# Patient Record
Sex: Male | Born: 1985 | Race: White | Hispanic: No | Marital: Single | Smoking: Current every day smoker
Health system: Southern US, Community
[De-identification: ages and names within clinical notes are randomized; demographics above are authoritative.]

## PROBLEM LIST (undated history)

## (undated) HISTORY — PX: TYMPANOSTOMY TUBE PLACEMENT: SHX32

---

## 2012-02-16 ENCOUNTER — Emergency Department: Payer: Self-pay | Admitting: Emergency Medicine

## 2012-03-29 ENCOUNTER — Other Ambulatory Visit: Payer: Self-pay | Admitting: Ophthalmology

## 2012-04-01 LAB — WOUND CULTURE

## 2012-11-04 ENCOUNTER — Emergency Department: Payer: Self-pay | Admitting: Emergency Medicine

## 2013-02-13 ENCOUNTER — Ambulatory Visit: Payer: Self-pay | Admitting: Family Medicine

## 2016-06-14 ENCOUNTER — Emergency Department: Payer: BLUE CROSS/BLUE SHIELD

## 2016-06-14 ENCOUNTER — Emergency Department
Admission: EM | Admit: 2016-06-14 | Discharge: 2016-06-14 | Disposition: A | Discharge status: Home / Self Care | Payer: BLUE CROSS/BLUE SHIELD | Attending: Emergency Medicine | Admitting: Emergency Medicine

## 2016-06-14 DIAGNOSIS — M79671 Pain in right foot: Secondary | ICD-10-CM | POA: Diagnosis present

## 2016-06-14 DIAGNOSIS — F17200 Nicotine dependence, unspecified, uncomplicated: Secondary | ICD-10-CM | POA: Insufficient documentation

## 2016-06-14 DIAGNOSIS — L03115 Cellulitis of right lower limb: Secondary | ICD-10-CM | POA: Insufficient documentation

## 2016-06-14 MED ORDER — TRAMADOL HCL 50 MG PO TABS
50.0000 mg | ORAL_TABLET | Freq: Once | ORAL | Status: AC
  Administered 2016-06-14: 50 mg via ORAL
  Filled 2016-06-14: qty 1

## 2016-06-14 MED ORDER — KETOROLAC TROMETHAMINE 60 MG/2ML IM SOLN
60.0000 mg | Freq: Once | INTRAMUSCULAR | Status: DC
  Filled 2016-06-14: qty 2

## 2016-06-14 MED ORDER — CLINDAMYCIN HCL 150 MG PO CAPS
300.0000 mg | ORAL_CAPSULE | Freq: Once | ORAL | Status: AC
  Administered 2016-06-14: 300 mg via ORAL
  Filled 2016-06-14: qty 2

## 2016-06-14 MED ORDER — CLINDAMYCIN HCL 300 MG PO CAPS: 300.0000 mg | ORAL_CAPSULE | Freq: Three times a day (TID) | ORAL | 0 refills | Status: AC

## 2016-06-14 MED ORDER — TRAMADOL HCL 50 MG PO TABS: 50.0000 mg | ORAL_TABLET | Freq: Four times a day (QID) | ORAL | 0 refills | Status: DC | PRN

## 2016-06-14 NOTE — ED Provider Notes (Signed)
[facility] Emergency Department Provider Note   ____________________________________________   First MD Initiated Contact with Patient 06/14/16 0245     (approximate)  I have reviewed the triage vital signs and the nursing notes.   HISTORY  Chief Complaint Foot Pain (Right)    HPI Glenn Carlson is a 30 y.o. male who comes into the hospital today with foot pain. He reports that he was at work and hit it on something metal at the shot. He reports that he didn't think much of it and then went to his friend's house. He was at a party and his foot was swelling and having some pain. He reports that his foot has been bruised as well. They did attempt to rest ice and elevate his foot but the pain has continued. Tonight the patient was taking a shower and when he tried to go to bed the pain was so excruciating he couldn't tolerate it anymore. According to mom the patient has taken nothing for his pain. He's had broken fingers and knuckles in the past but has never broken his foot. The patient rates his pain a 7 out of 10 in intensity. He is here today for evaluation. He is able to move his foot but is very tender to touch.   History reviewed. No pertinent past medical history.  There are no active problems to display for this patient.   History reviewed. No pertinent surgical history.  Prior to Admission medications   Medication Sig Start Date End Date Taking? Authorizing Provider  clindamycin (CLEOCIN) 300 MG capsule Take 1 capsule (300 mg total) by mouth 3 (three) times daily. 06/14/16 06/24/16  Rebecka ApleyAllison P Lamira Borin, MD  traMADol (ULTRAM) 50 MG tablet Take 1 tablet (50 mg total) by mouth every 6 (six) hours as needed. 06/14/16   Rebecka ApleyAllison P Desarai Barrack, MD    Allergies Patient has no known allergies.  No family history on file.  Social History Social History  Substance Use Topics  . Smoking status: Current Every Day Smoker  . Smokeless tobacco: Not on file  .  Alcohol use Yes    Review of Systems Constitutional: No fever/chills Eyes: No visual changes. ENT: No sore throat. Cardiovascular: Denies chest pain. Respiratory: Denies shortness of breath. Gastrointestinal: No abdominal pain.  No nausea, no vomiting.  No diarrhea.  No constipation. Genitourinary: Negative for dysuria. Musculoskeletal: Right foot pain Skin: Negative for rash. Neurological: Negative for headaches, focal weakness or numbness.  10-point ROS otherwise negative.  ____________________________________________   PHYSICAL EXAM:  VITAL SIGNS: ED Triage Vitals  Enc Vitals Group     BP 06/14/16 0113 (!) 139/96     Pulse Rate 06/14/16 0113 93     Resp 06/14/16 0113 16     Temp 06/14/16 0113 97.4 F (36.3 C)     Temp Source 06/14/16 0113 Oral     SpO2 06/14/16 0113 98 %     Weight 06/14/16 0111 132 lb (59.9 kg)     Height 06/14/16 0111 5\' 6"  (1.676 m)     Head Circumference --      Peak Flow --      Pain Score 06/14/16 0112 8     Pain Loc --      Pain Edu? --      Excl. in GC? --     Constitutional: Alert and oriented. Well appearing and in mild distress. Eyes: Conjunctivae are normal. PERRL. EOMI. Head: Atraumatic. Nose: No congestion/rhinnorhea. Mouth/Throat: Mucous membranes are  moist.  Oropharynx non-erythematous. Cardiovascular: Normal rate, regular rhythm. Grossly normal heart sounds.  Good peripheral circulation. Respiratory: Normal respiratory effort.  No retractions. Lungs CTAB. Gastrointestinal: Soft and nontender. No distention. Positive bowel sounds Musculoskeletal: Erythema to the top of the patient's right foot with some swelling and tenderness to palpation. The area is warm as well. No pain to palpation of passive range of motion of the foot or ankle. The patient reports that his foot hurts when I flex his toes.  Neurologic:  Normal speech and language.  Skin:  Skin is warm, dry and intact. Erythema to the top of the patient's foot. Psychiatric:  Mood and affect are normal.   ____________________________________________   LABS (all labs ordered are listed, but only abnormal results are displayed)  Labs Reviewed - No data to display ____________________________________________  EKG  none ____________________________________________  RADIOLOGY  Right foot x-ray ____________________________________________   PROCEDURES  Procedure(s) performed: None  Procedures  Critical Care performed: No  ____________________________________________   INITIAL IMPRESSION / ASSESSMENT AND PLAN / ED COURSE  Pertinent labs & imaging results that were available during my care of the patient were reviewed by me and considered in my medical decision making (see chart for details).  This is a 30 year old male who comes into the hospital today with some right foot pain. The patient reports that he didn't think hitting his toe caused the discomfort. The patient does have some warmth and erythema with a concern for cellulitis. I will give the patient a shot of Toradol as well as some clindamycin. I will also give the patient some tramadol for his pain. I will evaluate the patient's x-ray.  Clinical Course as of Jun 15 327  [facility]  0245 Negative. DG Foot Complete Right [AW]    Clinical Course User Index [AW] Rebecka ApleyAllison P Yaseen Gilberg, MD    The patient does not have any broken bones in his foot. I will give the patient some crutches and treat him for cellulitis. I will have her follow-up with the podiatrist. The patient will be discharged home. ____________________________________________   FINAL CLINICAL IMPRESSION(S) / ED DIAGNOSES  Final diagnoses:  Cellulitis of right lower extremity  Right foot pain      NEW MEDICATIONS STARTED DURING THIS VISIT:  New Prescriptions   CLINDAMYCIN (CLEOCIN) 300 MG CAPSULE    Take 1 capsule (300 mg total) by mouth 3 (three) times daily.   TRAMADOL (ULTRAM) 50 MG TABLET    Take 1 tablet  (50 mg total) by mouth every 6 (six) hours as needed.     Note:  This document was prepared using Dragon voice recognition software and may include unintentional dictation errors.    Rebecka ApleyAllison P Johnnisha Forton, MD 06/14/16 [phone]

## 2016-06-14 NOTE — ED Triage Notes (Signed)
Patient presents with right foot pain. Unsure of injury and states he "doesn't know what he did to it." Top of right foot is red and slightly swollen. Painful to palpation. Motor/sensation intact.

## 2016-06-14 NOTE — ED Notes (Signed)
Family refused crutches.

## 2016-06-14 NOTE — ED Notes (Signed)
Pt discharged to home.  Family member driving.  Discharge instructions reviewed.  Verbalized understanding.  No questions or concerns at this time.  Teach back verified.  Pt in NAD.  No items left in ED.   

## 2017-12-01 ENCOUNTER — Other Ambulatory Visit: Payer: Self-pay

## 2017-12-01 ENCOUNTER — Encounter: Payer: Self-pay | Admitting: Gynecology

## 2017-12-01 ENCOUNTER — Ambulatory Visit
Admission: EM | Admit: 2017-12-01 | Discharge: 2017-12-01 | Disposition: A | Discharge status: Home / Self Care | Payer: BLUE CROSS/BLUE SHIELD | Attending: Family Medicine | Admitting: Family Medicine

## 2017-12-01 DIAGNOSIS — L0231 Cutaneous abscess of buttock: Secondary | ICD-10-CM

## 2017-12-01 DIAGNOSIS — S40862A Insect bite (nonvenomous) of left upper arm, initial encounter: Secondary | ICD-10-CM | POA: Diagnosis not present

## 2017-12-01 DIAGNOSIS — W57XXXA Bitten or stung by nonvenomous insect and other nonvenomous arthropods, initial encounter: Secondary | ICD-10-CM | POA: Diagnosis not present

## 2017-12-01 DIAGNOSIS — J3489 Other specified disorders of nose and nasal sinuses: Secondary | ICD-10-CM

## 2017-12-01 DIAGNOSIS — L0291 Cutaneous abscess, unspecified: Secondary | ICD-10-CM

## 2017-12-01 MED ORDER — DOXYCYCLINE HYCLATE 100 MG PO CAPS: 100.0000 mg | ORAL_CAPSULE | Freq: Two times a day (BID) | ORAL | 0 refills | Status: DC

## 2017-12-01 MED ORDER — HYDROCODONE-ACETAMINOPHEN 5-325 MG PO TABS: 1.0000 | ORAL_TABLET | Freq: Three times a day (TID) | ORAL | 0 refills | Status: DC | PRN

## 2017-12-01 MED ORDER — MUPIROCIN 2 % EX OINT: 1.0000 "application " | TOPICAL_OINTMENT | Freq: Two times a day (BID) | CUTANEOUS | 0 refills | Status: AC

## 2017-12-01 NOTE — Discharge Instructions (Signed)
Meds as prescribed.  Keep the wound covered if possible.  Take care  Dr. Adriana Simas

## 2017-12-01 NOTE — ED Triage Notes (Signed)
Per patient c/o right nostril infection / cyst on right buttock painful to sit and tick bite on left arm.

## 2017-12-01 NOTE — ED Provider Notes (Signed)
[facility]    CSN: [id number] Arrival date & time: 12/01/17  1755  History   Chief Complaint Chief Complaint  Patient presents with  . Cyst  . Insect Bite   HPI  32 year old male presents with multiple complaints.  Patient states that on Tuesday he developed a sore in his right nostril.  He states that he squeezed the area and pus came out.  This is continues to recur.  He reports redness as well as pain of the right nostril.  He has felt feverish but does not have a temperature.  No known exacerbating or relieving factors.  Additionally, patient has had a recent tick bite which is concerning for him.  Occurred on the left arm.  Lastly, patient has developed an abscess of his right buttock.  This just appeared a couple days ago.  He reports that his pain is severe.  Difficulty sitting.  He is taken over-the-counter pain medication without improvement.  No other associated symptoms.  No other complaints.  PMH -patellofemoral syndrome, hx of abscess.  Past Surgical History:  Procedure Laterality Date  . TYMPANOSTOMY TUBE PLACEMENT     Home Medications    Prior to Admission medications   Medication Sig Start Date End Date Taking? Authorizing Provider  doxycycline (VIBRAMYCIN) 100 MG capsule Take 1 capsule (100 mg total) by mouth 2 (two) times daily. 12/01/17   Tommie Sams, DO  HYDROcodone-acetaminophen (NORCO/VICODIN) 5-325 MG tablet Take 1 tablet by mouth every 8 (eight) hours as needed. 12/01/17   Tommie Sams, DO  mupirocin ointment (BACTROBAN) 2 % Place 1 application into the nose 2 (two) times daily for 7 days. 12/01/17 12/08/17  Tommie Sams, DO   Family History Family History  Problem Relation Age of Onset  . Hypertension Mother   . Diabetes Father   . Hypertension Father   . Gout Father    Social History Social History   Tobacco Use  . Smoking status: Former Games developer  . Smokeless tobacco: Never Used  Substance Use Topics  . Alcohol use: Yes  . Drug use:  Never   Allergies   Patient has no known allergies.  Review of Systems Review of Systems  HENT:       Sore in nose.  Skin:       Abscess.    Physical Exam Triage Vital Signs ED Triage Vitals  Enc Vitals Group     BP 12/01/17 1807 123/88     Pulse Rate 12/01/17 1807 97     Resp 12/01/17 1807 16     Temp 12/01/17 1807 98.3 F (36.8 C)     Temp Source 12/01/17 1807 Oral     SpO2 12/01/17 1807 100 %     Weight 12/01/17 1808 155 lb (70.3 kg)     Height --      Head Circumference --      Peak Flow --      Pain Score 12/01/17 1808 8     Pain Loc --      Pain Edu? --      Excl. in GC? --    Updated Vital Signs BP 123/88 (BP Location: Left Arm)   Pulse 97   Temp 98.3 F (36.8 C) (Oral)   Resp 16   Wt 155 lb (70.3 kg)   SpO2 100%   BMI 25.02 kg/m   Physical Exam  Constitutional: He is oriented to person, place, and time. He appears well-developed.  Distress secondary to  pain.  HENT:  Head: Normocephalic and atraumatic.  Patient has a lesion in his nose.  He has erythema of the nostril/right side of his nose.  No appreciable abscess.  Pulmonary/Chest: Effort normal. No respiratory distress.  Neurological: He is alert and oriented to person, place, and time.  Skin:  Right buttock abscess with induration (located near the intergluteal cleft).  Minimal fluctuance.  Area of tick bite noted on the left upper arm reveals an eschar.  No surrounding erythema.  Psychiatric: He has a normal mood and affect. His behavior is normal.  Nursing note and vitals reviewed.  UC Treatments / Results  Labs (all labs ordered are listed, but only abnormal results are displayed) Labs Reviewed - No data to display  EKG None  Radiology No results found.  Procedures Incision and Drainage  Date/Time: 12/01/2017 8:04 PM  Performed by: Tommie Sams, DO  Authorized by: Tommie Sams, DO   Consent:    Consent obtained:  Verbal   Consent given by:  Patient Location:    Type:   Abscess   Location:  Lower extremity   Lower extremity location:  Buttock   Buttock location:  R buttock Pre-procedure details:    Skin preparation:  Betadine Anesthesia (see MAR for exact dosages):    Anesthesia method:  Local infiltration   Local anesthetic:  Lidocaine 1% WITH epi Procedure type:    Complexity:  Simple Procedure details:    Incision types:  Single straight   Scalpel blade:  11   Drainage:  Bloody (Minimal purulence.)   Drainage amount:  Scant   Wound treatment:  Wound left open   Packing materials:  None Post-procedure details:    Patient tolerance of procedure:  Tolerated well, no immediate complications   (including critical care time)  Medications Ordered in UC Medications - No data to display  Initial Impression / Assessment and Plan / UC Course  I have reviewed the triage vital signs and the nursing notes.  Pertinent labs & imaging results that were available during my care of the patient were reviewed by me and considered in my medical decision making (see chart for details).    32 year old male presents with buttock abscess, nasal sore/vestibulitis, and tick bite.  Incision and drainage performed today.  Minimal drainage.  Wound was left open.  No packing.  Placing on doxycycline and Bactroban.  Vicodin for pain.  [place] database was reviewed.  No concerns.  Final Clinical Impressions(s) / UC Diagnoses   Final diagnoses:  Abscess  Nasal vestibulitis  Tick bite, initial encounter     Discharge Instructions     Meds as prescribed.  Keep the wound covered if possible.  Take care  Dr. Adriana Simas    ED Prescriptions    Medication Sig Dispense Auth. Provider   doxycycline (VIBRAMYCIN) 100 MG capsule Take 1 capsule (100 mg total) by mouth 2 (two) times daily. 20 capsule Eldora, Elvera Almario G, DO   mupirocin ointment (BACTROBAN) 2 % Place 1 application into the nose 2 (two) times daily for 7 days. 22 g Bacci, Kisa Fujii G, DO   HYDROcodone-acetaminophen  (NORCO/VICODIN) 5-325 MG tablet Take 1 tablet by mouth every 8 (eight) hours as needed. 10 tablet Tommie Sams, DO     Controlled Substance Prescriptions [place] Controlled Substance Registry consulted? Yes, I have consulted the [place] Controlled Substances Registry for this patient, and feel the risk/benefit ratio today is favorable for proceeding with this prescription for a controlled substance.  Adriana Simas,  Verdis Frederickson, DO 12/01/17 2006

## 2018-08-14 ENCOUNTER — Other Ambulatory Visit: Payer: Self-pay

## 2018-08-14 ENCOUNTER — Emergency Department
Admission: EM | Admit: 2018-08-14 | Discharge: 2018-08-14 | Disposition: A | Discharge status: Home / Self Care | Payer: BLUE CROSS/BLUE SHIELD | Attending: Emergency Medicine | Admitting: Emergency Medicine

## 2018-08-14 ENCOUNTER — Encounter: Payer: Self-pay | Admitting: Emergency Medicine

## 2018-08-14 ENCOUNTER — Emergency Department: Payer: BLUE CROSS/BLUE SHIELD

## 2018-08-14 DIAGNOSIS — R1032 Left lower quadrant pain: Secondary | ICD-10-CM | POA: Diagnosis present

## 2018-08-14 DIAGNOSIS — N12 Tubulo-interstitial nephritis, not specified as acute or chronic: Secondary | ICD-10-CM | POA: Diagnosis not present

## 2018-08-14 DIAGNOSIS — N390 Urinary tract infection, site not specified: Secondary | ICD-10-CM | POA: Insufficient documentation

## 2018-08-14 DIAGNOSIS — Z87891 Personal history of nicotine dependence: Secondary | ICD-10-CM | POA: Insufficient documentation

## 2018-08-14 DIAGNOSIS — N23 Unspecified renal colic: Secondary | ICD-10-CM | POA: Diagnosis not present

## 2018-08-14 LAB — COMPREHENSIVE METABOLIC PANEL
ALT: 21 U/L (ref 0–44)
AST: 27 U/L (ref 15–41)
Albumin: 4.5 g/dL (ref 3.5–5.0)
Alkaline Phosphatase: 50 U/L (ref 38–126)
Anion gap: 7 (ref 5–15)
BUN: 14 mg/dL (ref 6–20)
CHLORIDE: 106 mmol/L (ref 98–111)
CO2: 26 mmol/L (ref 22–32)
Calcium: 9 mg/dL (ref 8.9–10.3)
Creatinine, Ser: 0.87 mg/dL (ref 0.61–1.24)
GFR calc Af Amer: >60 mL/min (ref >60)
Glucose, Bld: 156 mg/dL — ABNORMAL HIGH (ref 70–99)
Potassium: 3.6 mmol/L (ref 3.5–5.1)
Sodium: 139 mmol/L (ref 135–145)
Total Bilirubin: 2.2 mg/dL — ABNORMAL HIGH (ref 0.3–1.2)
Total Protein: 7.7 g/dL (ref 6.5–8.1)

## 2018-08-14 LAB — CBC WITH DIFFERENTIAL/PLATELET
Abs Immature Granulocytes: 0.03 10*3/uL (ref 0.00–0.07)
BASOS ABS: 0 10*3/uL (ref 0.0–0.1)
BASOS PCT: 0 %
EOS ABS: 0.1 10*3/uL (ref 0.0–0.5)
Eosinophils Relative: 1 %
HCT: 43.9 % (ref 39.0–52.0)
Hemoglobin: 15.2 g/dL (ref 13.0–17.0)
IMMATURE GRANULOCYTES: 1 %
Lymphocytes Relative: 32 %
Lymphs Abs: 2.1 10*3/uL (ref 0.7–4.0)
MCH: 30 pg (ref 26.0–34.0)
MCHC: 34.6 g/dL (ref 30.0–36.0)
MCV: 86.6 fL (ref 80.0–100.0)
MONOS PCT: 6 %
Monocytes Absolute: 0.4 10*3/uL (ref 0.1–1.0)
NEUTROS PCT: 60 %
NRBC: 0 % (ref 0.0–0.2)
Neutro Abs: 3.9 10*3/uL (ref 1.7–7.7)
PLATELETS: 274 10*3/uL (ref 150–400)
RBC: 5.07 MIL/uL (ref 4.22–5.81)
RDW: 12.7 % (ref 11.5–15.5)
WBC: 6.5 10*3/uL (ref 4.0–10.5)

## 2018-08-14 LAB — URINALYSIS, COMPLETE (UACMP) WITH MICROSCOPIC
BILIRUBIN URINE: NEGATIVE
Glucose, UA: NEGATIVE mg/dL
Ketones, ur: NEGATIVE mg/dL
Leukocytes, UA: NEGATIVE
Nitrite: NEGATIVE
PH: 5 (ref 5.0–8.0)
Protein, ur: 30 mg/dL — AB
SPECIFIC GRAVITY, URINE: 1.021 (ref 1.005–1.030)

## 2018-08-14 LAB — LIPASE, BLOOD: LIPASE: 31 U/L (ref 11–51)

## 2018-08-14 MED ORDER — OXYCODONE-ACETAMINOPHEN 5-325 MG PO TABS: 1.0000 | ORAL_TABLET | Freq: Three times a day (TID) | ORAL | 0 refills | Status: DC | PRN

## 2018-08-14 MED ORDER — OXYCODONE-ACETAMINOPHEN 5-325 MG PO TABS
2.0000 | ORAL_TABLET | Freq: Once | ORAL | Status: AC
  Administered 2018-08-14: 2 via ORAL
  Filled 2018-08-14: qty 2

## 2018-08-14 MED ORDER — KETOROLAC TROMETHAMINE 30 MG/ML IJ SOLN
30.0000 mg | Freq: Once | INTRAMUSCULAR | Status: AC
  Administered 2018-08-14: 30 mg via INTRAVENOUS
  Filled 2018-08-14: qty 1

## 2018-08-14 MED ORDER — ONDANSETRON 4 MG PO TBDP: 4.0000 mg | ORAL_TABLET | Freq: Three times a day (TID) | ORAL | 0 refills | Status: DC | PRN

## 2018-08-14 MED ORDER — TAMSULOSIN HCL 0.4 MG PO CAPS: 0.4000 mg | ORAL_CAPSULE | Freq: Every day | ORAL | 0 refills | Status: DC

## 2018-08-14 MED ORDER — ONDANSETRON HCL 4 MG/2ML IJ SOLN
4.0000 mg | Freq: Once | INTRAMUSCULAR | Status: AC
  Administered 2018-08-14: 4 mg via INTRAVENOUS
  Filled 2018-08-14: qty 2

## 2018-08-14 NOTE — ED Notes (Signed)
Patient transported to CT 

## 2018-08-14 NOTE — ED Triage Notes (Signed)
Left groin and lower abdominal pain.  Onset last night.

## 2018-08-14 NOTE — ED Provider Notes (Signed)
[facility] Emergency Department Provider Note       Time seen: ----------------------------------------- 7:56 AM on 08/14/2018 -----------------------------------------   I have reviewed the triage vital signs and the nursing notes.  HISTORY   Chief Complaint Groin Pain and Abdominal Pain    HPI Glenn Carlson is a 33 y.o. male with a history of kidney stones who presents to the ED for left groin pain and left lower quadrant abdominal pain.  Onset was last night.  Patient also had some pain that radiated into his testicle and into his back.  He does report a history of kidney stones before.  He has had nausea.  Denies any other complaints.  History reviewed. No pertinent past medical history.  There are no active problems to display for this patient.   Past Surgical History:  Procedure Laterality Date  . TYMPANOSTOMY TUBE PLACEMENT      Allergies Patient has no known allergies.  Social History Social History   Tobacco Use  . Smoking status: Former Games developer  . Smokeless tobacco: Never Used  Substance Use Topics  . Alcohol use: Yes  . Drug use: Never   Review of Systems Constitutional: Negative for fever. Cardiovascular: Negative for chest pain. Respiratory: Negative for shortness of breath. Gastrointestinal: Positive for flank pain, nausea Musculoskeletal: Positive for back pain Skin: Negative for rash. Neurological: Negative for headaches, focal weakness or numbness.  All systems negative/normal/unremarkable except as stated in the HPI  ____________________________________________   PHYSICAL EXAM:  VITAL SIGNS: ED Triage Vitals  Enc Vitals Group     BP --      Pulse --      Resp --      Temp --      Temp src --      SpO2 --      Weight 08/14/18 0751 154 lb 15.7 oz (70.3 kg)     Height 08/14/18 0751 5\' 6"  (1.676 m)     Head Circumference --      Peak Flow --      Pain Score 08/14/18 0750 10     Pain Loc --      Pain Edu?  --      Excl. in GC? --    Constitutional: Alert and oriented.  Mild distress from pain Eyes: Conjunctivae are normal. Normal extraocular movements. ENT      Head: Normocephalic and atraumatic.      Nose: No congestion/rhinnorhea.      Mouth/Throat: Mucous membranes are moist.      Neck: No stridor. Cardiovascular: Normal rate, regular rhythm. No murmurs, rubs, or gallops. Respiratory: Normal respiratory effort without tachypnea nor retractions. Breath sounds are clear and equal bilaterally. No wheezes/rales/rhonchi. Gastrointestinal: No flank tenderness, no rebound or guarding.  Normal bowel sounds. Musculoskeletal: Nontender with normal range of motion in extremities. No lower extremity tenderness nor edema. Neurologic:  Normal speech and language. No gross focal neurologic deficits are appreciated.  Skin:  Skin is warm, dry and intact. No rash noted. Psychiatric: Mood and affect are normal. Speech and behavior are normal.  ____________________________________________  ED COURSE:  As part of my medical decision making, I reviewed the following data within the electronic MEDICAL RECORD NUMBER History obtained from family if available, nursing notes, old chart and ekg, as well as notes from prior ED visits. Patient presented for flank pain, we will assess with labs and imaging as indicated at this time.   Procedures ____________________________________________   LABS (pertinent positives/negatives)  Labs Reviewed  COMPREHENSIVE METABOLIC PANEL - Abnormal; Notable for the following components:      Result Value   Glucose, Bld 156 (*)    Total Bilirubin 2.2 (*)    All other components within normal limits  URINALYSIS, COMPLETE (UACMP) WITH MICROSCOPIC - Abnormal; Notable for the following components:   Color, Urine YELLOW (*)    APPearance HAZY (*)    Hgb urine dipstick LARGE (*)    Protein, ur 30 (*)    RBC / HPF >50 (*)    Bacteria, UA RARE (*)    All other components within  normal limits  CBC WITH DIFFERENTIAL/PLATELET  LIPASE, BLOOD    RADIOLOGY Images were viewed by me  CT renal protocol IMPRESSION: 1. 2 mm calculus distal left ureter with moderate hydronephrosis on the left.  2. 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 There also several small prostatic calculi.  3. No evident bowel obstruction. No abscess in the abdomen or pelvis. Appendix appears normal. ____________________________________________   DIFFERENTIAL DIAGNOSIS   Renal colic, UTI, pyelonephritis  FINAL ASSESSMENT AND PLAN  Renal colic   Plan: The patient had presented for flank pain. Patient's labs did indicate hematuria consistent with renal colic. Patient's imaging from a 2 mm distal left ureteral stone.  Pain was under control.  He will be given pain medicine, antiemetics and Flomax with urology referral.   Ulice Dash, MD    Note: This note was generated in part or whole with voice recognition software. Voice recognition is usually quite accurate but there are transcription errors that can and very often do occur. I apologize for any typographical errors that were not detected and corrected.     Emily Filbert, MD 08/14/18 1116

## 2019-06-19 IMAGING — CT CT RENAL STONE PROTOCOL
3 of 4 series · 9 of 46 positions shown, 14 images · non-contrast
Comparison: None.

CLINICAL DATA: Abdominal pain, primarily left-sided

EXAM:
CT ABDOMEN AND PELVIS WITHOUT CONTRAST
TECHNIQUE: Multidetector CT imaging of the abdomen and pelvis was performed
following the standard protocol without oral or IV contrast.

[Series 4: lung bases · axial · 0.63mm/px · z∈[-166,-91]mm · 5 of 23 slices shown, 10 images]
[im 4/23  soft-tissue]
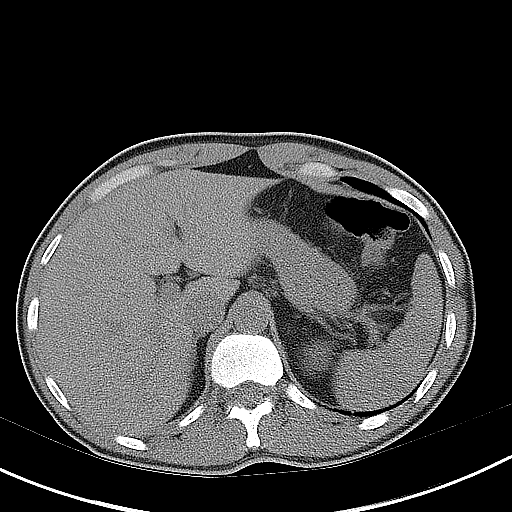
[im 4/23  bone]
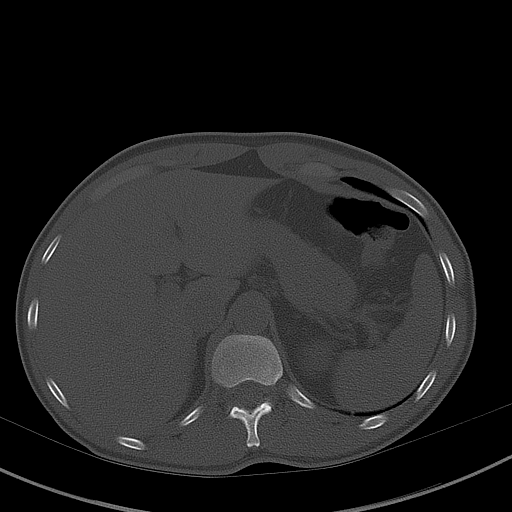
[im 8/23  soft-tissue]
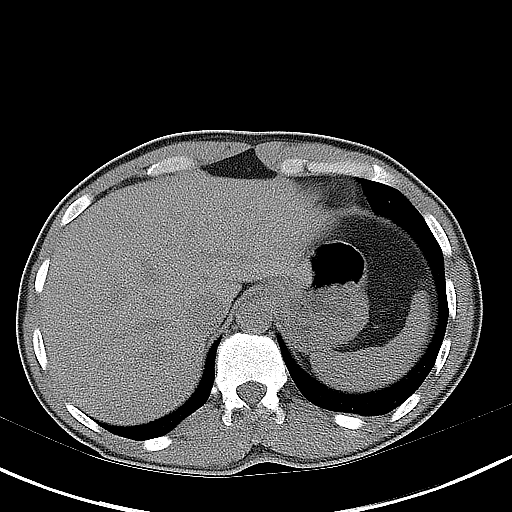
[im 8/23  lung]
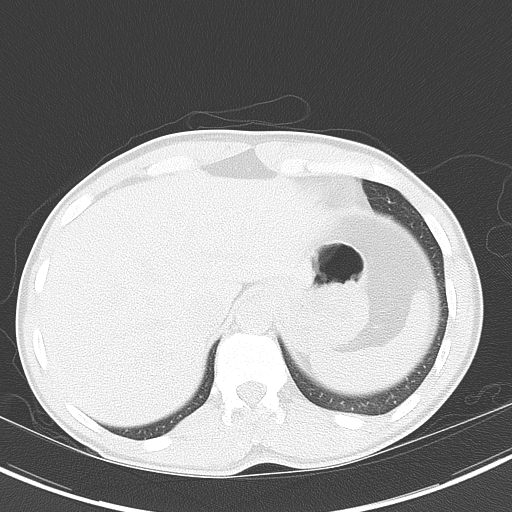
[im 12/23  soft-tissue]
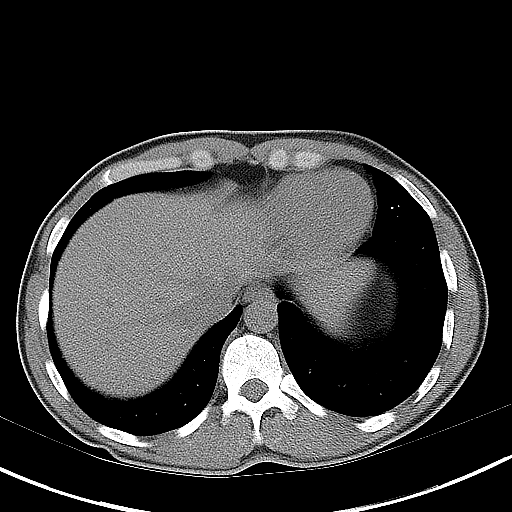
[im 12/23  lung]
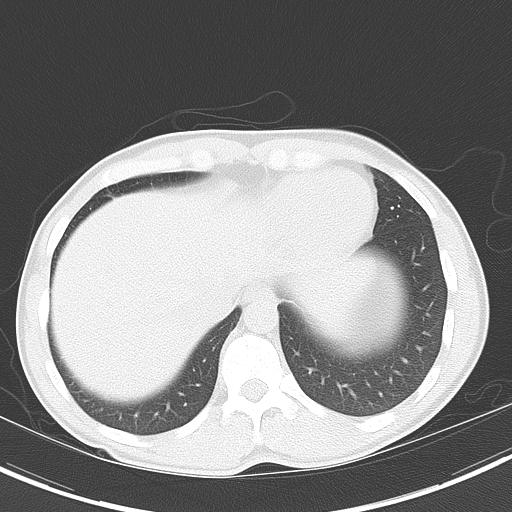
[im 15/23  soft-tissue]
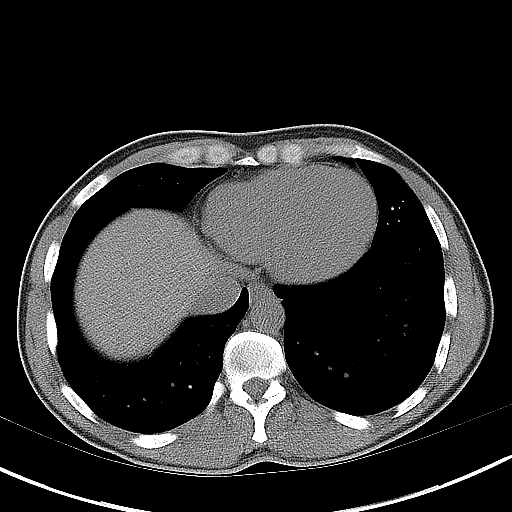
[im 15/23  lung]
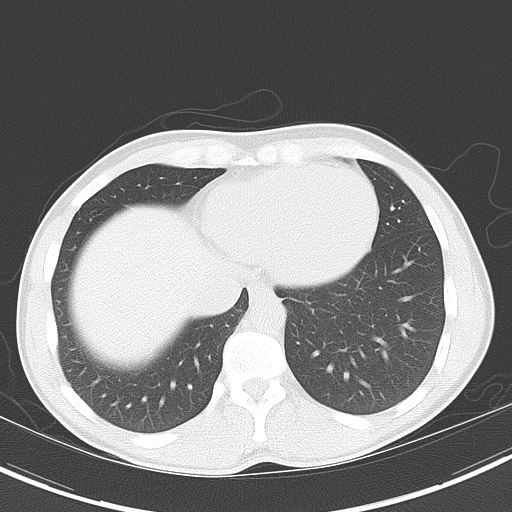
[im 19/23  soft-tissue]
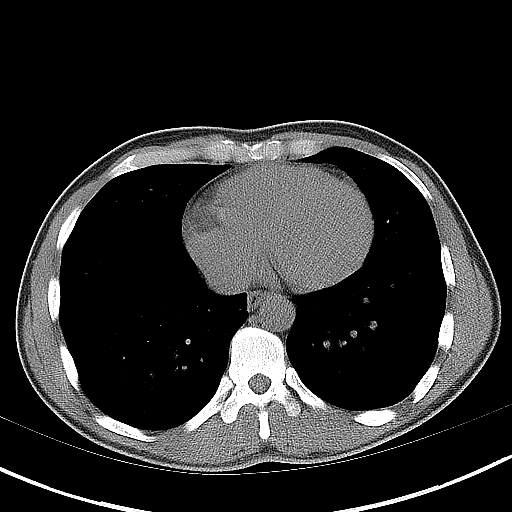
[im 19/23  lung]
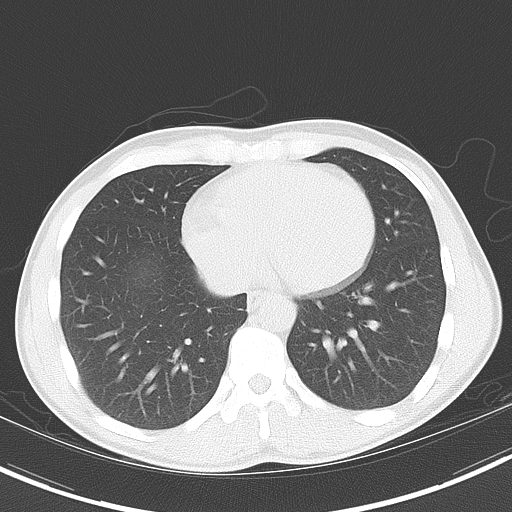

[Series 5: coronal · coronal · 0.55mm/px · 3 of 116 slices shown]
[im 39/116  soft-tissue]
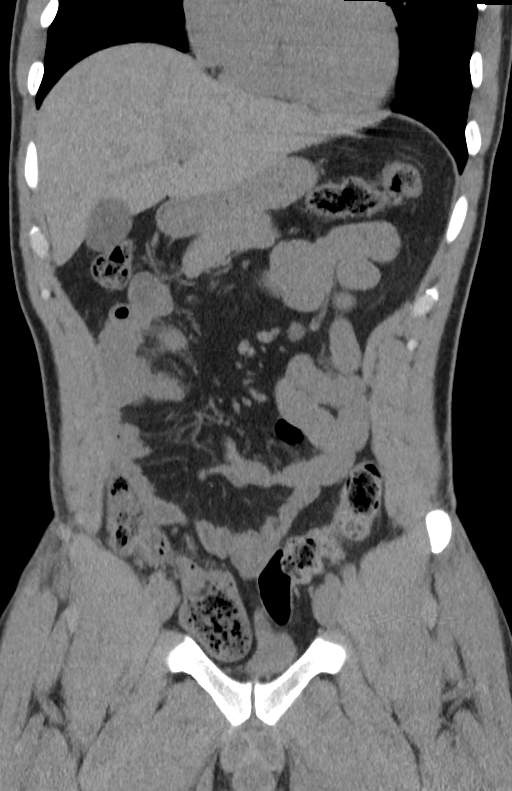
[im 52/116  soft-tissue]
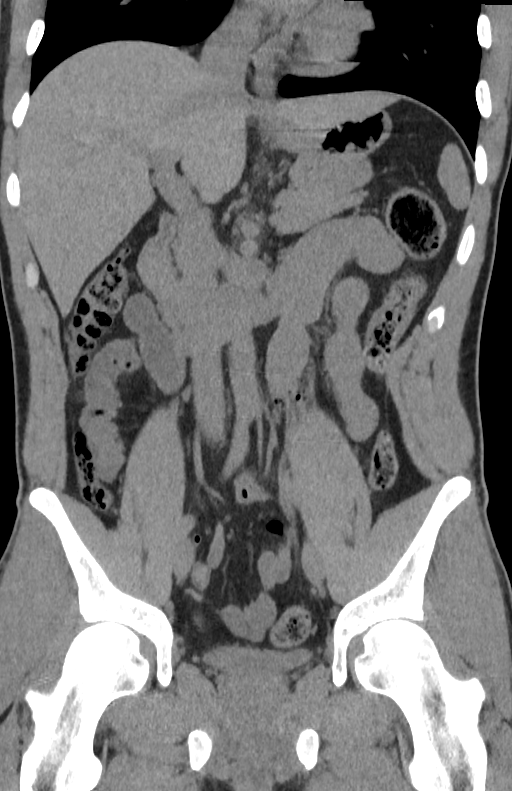
[im 64/116  soft-tissue]
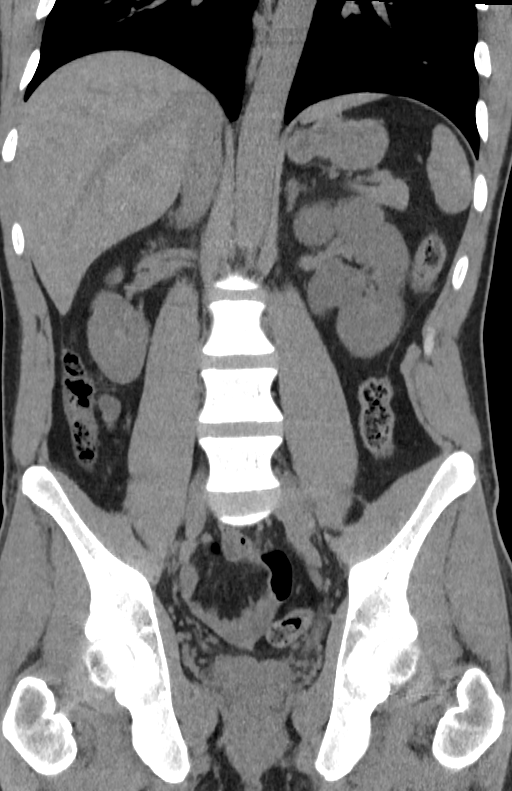

[Series 6: sagittal · sagittal · 0.45mm/px · 1 of 146 slices shown]
[im 49/146  soft-tissue]
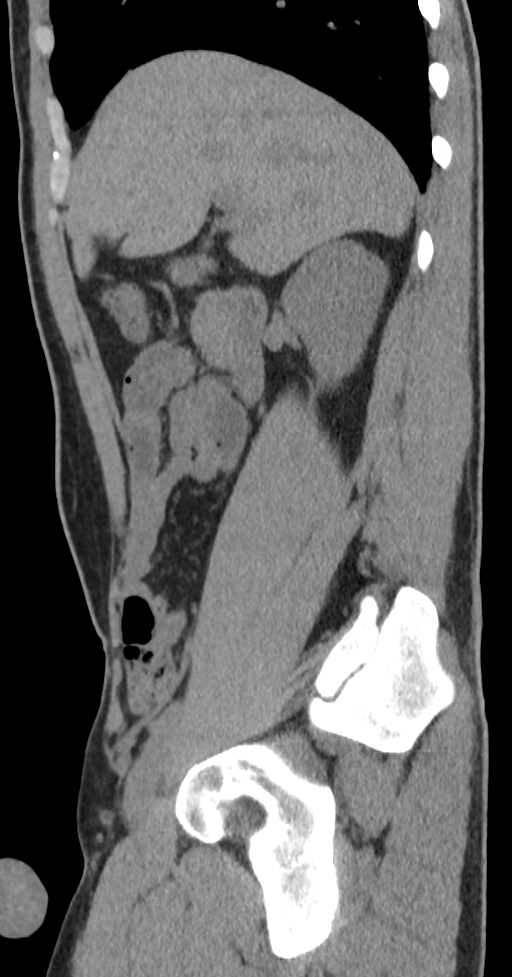

[9 of 46 positions shown; findings below may reference images not displayed]

FINDINGS: Lower chest: Lung bases are clear.

Hepatobiliary: No focal liver lesions are appreciable on this
noncontrast enhanced study. Gallbladder wall is not appreciably
thickened. There is no biliary duct dilatation.

Pancreas: There is no pancreatic mass or inflammatory focus.

Spleen: No splenic lesions are evident.

Adrenals/Urinary Tract: Adrenals bilaterally appear normal. There is
mild nephrocalcinosis on each side. There is no renal mass on either
side. There is moderate hydronephrosis on the left. There is no
appreciable hydronephrosis on the right. There is a 1 mm calculus in
the lower pole the right kidney with an adjacent 2 mm calculus.
There is a 1 mm calculus in the mid left kidney. There is a calculus
measuring 2 mm in the distal left ureter near the ureterovesical
junction. No other ureteral calculi are evident on either side.
Urinary bladder is midline with wall thickness within normal limits
for essentially decompressed bladder.

Stomach/Bowel: There is no appreciable bowel wall or mesenteric
thickening. There is no evident bowel obstruction. There is no free
air or portal venous air.

Vascular/Lymphatic: There is no abdominal aortic aneurysm. No
vascular lesions are evident on this noncontrast enhanced study. No
adenopathy is evident in the abdomen or pelvis.

Reproductive: There are occasional prostatic calculi. Prostate and
seminal vesicles are normal in size and contour. No pelvic mass
evident.

Other: Appendix appears normal. There is no abscess or ascites in
the abdomen or pelvis.

Musculoskeletal: No blastic or lytic bone lesions. There is no
intramuscular or abdominal wall lesion.
IMPRESSION: 1. 2 mm calculus distal left ureter with moderate hydronephrosis on
the left.

2. Nephrocalcinosis bilaterally with small calculi in each kidney.
There also several small prostatic calculi.

3. No evident bowel obstruction. No abscess in the abdomen or
pelvis. Appendix appears normal.

## 2022-10-16 ENCOUNTER — Other Ambulatory Visit: Payer: Self-pay

## 2022-10-16 ENCOUNTER — Ambulatory Visit
Admission: EM | Admit: 2022-10-16 | Discharge: 2022-10-16 | Disposition: A | Discharge status: Home / Self Care | Payer: BC Managed Care – PPO | Attending: Emergency Medicine | Admitting: Emergency Medicine

## 2022-10-16 DIAGNOSIS — Z202 Contact with and (suspected) exposure to infections with a predominantly sexual mode of transmission: Secondary | ICD-10-CM

## 2022-10-16 MED ORDER — DOXYCYCLINE HYCLATE 100 MG PO CAPS: 100.0000 mg | ORAL_CAPSULE | Freq: Two times a day (BID) | ORAL | 0 refills | Status: AC

## 2022-10-16 NOTE — Discharge Instructions (Addendum)
Take the doxycycline twice daily with food for treatment of presumptive chlamydia following exposure.  Do not have unprotected sex until after you and your girlfriend have both completed your course of treatment.  A few have a positive test result you will receive a phone call tomorrow but if your result is negative it will appear in your MyChart.  If you are negative you can stop taking the doxycycline.

## 2022-10-16 NOTE — ED Provider Notes (Signed)
[facility]    CSN: [id number] Arrival date & time: 10/16/22  1513      History   Chief Complaint Chief Complaint  Patient presents with   Exposure to STD    HPI EFREN DORST is a 37 y.o. male.   HPI  37 year old male here for evaluation after being exposed to chlamydia.  He states that his current girlfriend, whom he has been with for the past month, tested positive for chlamydia yesterday.  He reports that she was negative for gonorrhea and trichomonas.  He denies wanting to be tested for HIV or syphilis.  He states that he is not having any pain with urination or penile discharge.  He also denies any penile pain.  He would like to be treated empirically.  History reviewed. No pertinent past medical history.  There are no problems to display for this patient.   Past Surgical History:  Procedure Laterality Date   TYMPANOSTOMY TUBE PLACEMENT         Home Medications    Prior to Admission medications   Medication Sig Start Date End Date Taking? Authorizing Provider  doxycycline (VIBRAMYCIN) 100 MG capsule Take 1 capsule (100 mg total) by mouth 2 (two) times daily for 7 days. 10/16/22 10/23/22 Yes Margarette Canada, NP    Family History Family History  Problem Relation Age of Onset   Hypertension Mother    Diabetes Father    Hypertension Father    Gout Father     Social History Social History   Tobacco Use   Smoking status: Every Day    Packs/day: .5    Types: Cigarettes   Smokeless tobacco: Never  Vaping Use   Vaping Use: Some days  Substance Use Topics   Alcohol use: Yes    Comment: rare   Drug use: Never     Allergies   Patient has no known allergies.   Review of Systems Review of Systems  Genitourinary:  Negative for dysuria, penile discharge, penile pain, penile swelling, scrotal swelling and testicular pain.     Physical Exam Triage Vital Signs ED Triage Vitals  Enc Vitals Group     BP      Pulse      Resp      Temp       Temp src      SpO2      Weight      Height      Head Circumference      Peak Flow      Pain Score      Pain Loc      Pain Edu?      Excl. in [place]?    No data found.  Updated Vital Signs BP 123/84 (BP Location: Right Arm)   Pulse 71   Temp 98 F (36.7 C) (Oral)   Resp 17   Ht 5\' 6"  (1.676 m)   Wt 135 lb (61.2 kg)   SpO2 98%   BMI 21.79 kg/m   Visual Acuity Right Eye Distance:   Left Eye Distance:   Bilateral Distance:    Right Eye Near:   Left Eye Near:    Bilateral Near:     Physical Exam Vitals and nursing note reviewed.  Constitutional:      Appearance: Normal appearance.  Genitourinary:    Penis: Normal.      Comments: No discharge from the urethral meatus.  No rashes on the glans penis or shaft of the penis.  Chancres or other lesions. Neurological:     Mental Status: He is alert.      UC Treatments / Results  Labs (all labs ordered are listed, but only abnormal results are displayed) Labs Reviewed  CYTOLOGY, (ORAL, ANAL, URETHRAL) ANCILLARY ONLY    EKG   Radiology No results found.  Procedures Procedures (including critical care time)  Medications Ordered in UC Medications - No data to display  Initial Impression / Assessment and Plan / UC Course  I have reviewed the triage vital signs and the nursing notes.  Pertinent labs & imaging results that were available during my care of the patient were reviewed by me and considered in my medical decision making (see chart for details).   Patient is a pleasant, nontoxic-appearing 37 year old male who found out that his relatively new girlfriend tested positive for chlamydia yesterday and he is requesting treatment.  He is not having any symptoms and his genital exam is benign.  I will collect a urethral cytology swab and sent for analysis to look for the presence of gonorrhea and chlamydia.  I will treat the patient [place] with doxycycline 100 mg twice daily for 7 days.  I have advised him to  abstain from unprotected sex until after he and his partner both completed treatment.   Final Clinical Impressions(s) / UC Diagnoses   Final diagnoses:  Exposure to STD     Discharge Instructions      Take the doxycycline twice daily with food for treatment of presumptive chlamydia following exposure.  Do not have unprotected sex until after you and your girlfriend have both completed your course of treatment.  A few have a positive test result you will receive a phone call tomorrow but if your result is negative it will appear in your MyChart.  If you are negative you can stop taking the doxycycline.     ED Prescriptions     Medication Sig Dispense Auth. Provider   doxycycline (VIBRAMYCIN) 100 MG capsule Take 1 capsule (100 mg total) by mouth 2 (two) times daily for 7 days. 14 capsule Margarette Canada, NP      PDMP not reviewed this encounter.   Margarette Canada, NP 10/16/22 1535

## 2022-10-16 NOTE — ED Triage Notes (Signed)
Pt reports he found out on Friday his gf has been diagnosed with Chlamydia. Pt denies sx

## 2022-10-18 LAB — CYTOLOGY, (ORAL, ANAL, URETHRAL) ANCILLARY ONLY
Chlamydia: POSITIVE — AB
Comment: NEGATIVE
Comment: NEGATIVE
Comment: NORMAL
Neisseria Gonorrhea: NEGATIVE
Trichomonas: NEGATIVE
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
## 2024-08-01 ENCOUNTER — Other Ambulatory Visit: Payer: Self-pay

## 2024-08-01 ENCOUNTER — Ambulatory Visit (INDEPENDENT_AMBULATORY_CARE_PROVIDER_SITE_OTHER): Admitting: Family Medicine

## 2024-08-01 ENCOUNTER — Encounter: Payer: Self-pay | Admitting: Family Medicine

## 2024-08-01 VITALS — BP 142/70 | HR 96 | Ht 66.0 in | Wt 144.0 lb

## 2024-08-01 DIAGNOSIS — L03114 Cellulitis of left upper limb: Secondary | ICD-10-CM | POA: Diagnosis not present

## 2024-08-01 DIAGNOSIS — M25522 Pain in left elbow: Secondary | ICD-10-CM | POA: Diagnosis not present

## 2024-08-01 MED ORDER — DOXYCYCLINE HYCLATE 100 MG PO TABS: 100.0000 mg | ORAL_TABLET | Freq: Two times a day (BID) | ORAL | 0 refills | Status: AC

## 2024-08-01 NOTE — Patient Instructions (Addendum)
 Good to see you. Doxy for 3 weeks Note for work 20# lifting limit for 3 weeks See me again at 7:30am on the 23rd

## 2024-08-01 NOTE — Assessment & Plan Note (Signed)
 Significant inflammation still noted, seems to be more concentrated at the olecranon area.  I do not see anything such as gouty deposits.  More concerning for a septic bursa the patient does not appear ill and does state that he is getting somewhat better slowly.  At this point would like to do another 3 weeks of doxycycline  to cover for any MRSA.  Patient knows if any worsening swelling to seek medical attention.  Would like to see patient again in 3 weeks to make sure that this completely responds to the treatment.  Do not see any loose body otherwise.  Worsening pain 2 would need to consider the possibility of imaging but I think it is highly unlikely at this time.  Follow-up again in 3 weeks.  Patient given note for work to do 20 pound lifting limit.

## 2024-08-15 NOTE — Progress Notes (Unsigned)
 " [facility] [address] [place] [address] Phone: [phone] Subjective:   Glenn Carlson, am serving as a scribe for Dr. Arthea Carlson.  I'm seeing this patient by the request  of:  Patient, No Pcp Per  CC: Left elbow pain follow-up  YEP:Dlagzrupcz  08/01/2024 Significant inflammation still noted, seems to be more concentrated at the olecranon area.  I do not see anything such as gouty deposits.  More concerning for a septic bursa the patient does not appear ill and does state that he is getting somewhat better slowly.  At this point would like to do another 3 weeks of doxycycline  to cover for any MRSA.  Patient knows if any worsening swelling to seek medical attention.  Would like to see patient again in 3 weeks to make sure that this completely responds to the treatment.  Do not see any loose body otherwise.  Worsening pain 2 would need to consider the possibility of imaging but I think it is highly unlikely at this time.  Follow-up again in 3 weeks.  Patient given note for work to do 20 pound lifting limit.      Update 08/17/2024 Glenn Carlson is a 39 y.o. male coming in with complaint of L elbow pain. Patient states better ROM today and minimal pain. Still has a sizable lump on elbow, but feeling much better.  States that it is a little approximately 90% better.  Has started to increase activity.  Has noticed some fatigue in the arm but nothing severe.  Patient is feeling much better overall.  Has been out of work though for the last 3 weeks.       No past medical history on file. Past Surgical History:  Procedure Laterality Date   TYMPANOSTOMY TUBE PLACEMENT     Social History   Socioeconomic History   Marital status: Single    Spouse name: Not on file   Number of children: Not on file   Years of education: Not on file   Highest education level: Not on file  Occupational History   Not on file  Tobacco Use   Smoking status: Every Day     Current packs/day: 0.50    Types: Cigarettes   Smokeless tobacco: Never  Vaping Use   Vaping status: Some Days  Substance and Sexual Activity   Alcohol use: Yes    Comment: rare   Drug use: Never   Sexual activity: Not on file  Other Topics Concern   Not on file  Social History Narrative   Not on file   Social Drivers of Health   Tobacco Use: High Risk (08/01/2024)   Patient History    Smoking Tobacco Use: Every Day    Smokeless Tobacco Use: Never    Passive Exposure: Not on file  Financial Resource Strain: Not on file  Food Insecurity: Not on file  Transportation Needs: Not on file  Physical Activity: Not on file  Stress: Not on file  Social Connections: Not on file  Depression (EYV7-0): Not on file  Alcohol Screen: Not on file  Housing: Unknown (02/03/2024)   Received from [facility]   Epic    Unable to Pay for Housing in the Last Year: Not on file    Number of Times Moved in the Last Year: Not on file    At any time in the past 12 months, were you homeless or living in a shelter (including now)?: No  Utilities: Not on file  Health Literacy: Not on file   Allergies[1] Family History  Problem Relation Age of Onset   Hypertension Mother    Diabetes Father    Hypertension Father    Gout Father     Current Outpatient Medications (Other):    doxycycline  (VIBRA -TABS) 100 MG tablet, Take 1 tablet (100 mg total) by mouth 2 (two) times daily.   doxycycline  (VIBRA -TABS) 100 MG tablet, Take 1 tablet (100 mg total) by mouth 2 (two) times daily.   Reviewed prior external information including notes and imaging from  primary care provider As well as notes that were available from care everywhere and other healthcare systems.  Past medical history, social, surgical and family history all reviewed in electronic medical record.  No pertanent information unless stated regarding to the chief complaint.   Review of Systems:  No headache, visual changes,  nausea, vomiting, diarrhea, constipation, dizziness, abdominal pain, skin rash, fevers, chills, night sweats, weight loss, swollen lymph nodes, body aches, joint swelling, chest pain, shortness of breath, mood changes. POSITIVE muscle aches  Objective  Blood pressure (!) 134/92, pulse 84, height 5' 6 (1.676 m), weight 144 lb (65.3 kg), SpO2 95%.   General: No apparent distress alert and oriented x3 mood and affect normal, dressed appropriately.  HEENT: Pupils equal, extraocular movements intact  Respiratory: Patient's speak in full sentences and does not appear short of breath  Cardiovascular: No lower extremity edema, non tender, no erythema  Left elbow exam shows significantly less swelling than previous exam.  Very minorly warm to touch.  No significant decrease in the erythema as well.  Full range of motion of the elbow now noted.  Limited muscular skeletal ultrasound was performed and interpreted by Carlson HUSSAR, M  Limited ultrasound still shows that patient does have some mild hypoechoic changes in the olecranon bursa with some still increasing in Doppler flow consistent with some inflammation.  No sign of any inflammation no noted of the cortex of the bone.  No cortical irregularity noted now with better visualization than previous ultrasound. Impression: Interval improvement but still some inflammation of the olecranon bursa    Impression and Recommendations:     The above documentation has been reviewed and is accurate and complete Chelsye Suhre M Holland Nickson, DO       [1] No Known Allergies  "

## 2024-08-17 ENCOUNTER — Other Ambulatory Visit: Payer: Self-pay

## 2024-08-17 ENCOUNTER — Encounter: Payer: Self-pay | Admitting: Family Medicine

## 2024-08-17 ENCOUNTER — Ambulatory Visit: Admitting: Family Medicine

## 2024-08-17 VITALS — BP 134/92 | HR 84 | Ht 66.0 in | Wt 144.0 lb

## 2024-08-17 DIAGNOSIS — M25522 Pain in left elbow: Secondary | ICD-10-CM | POA: Diagnosis not present

## 2024-08-17 DIAGNOSIS — L03114 Cellulitis of left upper limb: Secondary | ICD-10-CM

## 2024-08-17 MED ORDER — DOXYCYCLINE HYCLATE 100 MG PO TABS: 100.0000 mg | ORAL_TABLET | Freq: Two times a day (BID) | ORAL | 0 refills | Status: AC

## 2024-08-17 NOTE — Assessment & Plan Note (Signed)
 Significant improvement noted but still with significant Doppler flow noted in the olecranon bursa.  Likely hypoechoic changes significantly decreased.  Can see that there is a strong delineation between the bone and not as concern for any osteomyelitis.  Secondary to still having inflammation and patient going to be returning to work I do feel it is necessary for us  to do 2 more weeks of doxycycline  with patient doing a lot of manual labor.  Will do compression and icing regimen.  Follow-up with me again in 4 weeks to make sure completely resolved.  Red flags discussed

## 2024-08-17 NOTE — Patient Instructions (Signed)
 2 more weeks of doxycycline  Elbow Compression while working Keep skin moist Work note See you again in 4-6 weeks (ok to double)

## 2024-09-14 ENCOUNTER — Ambulatory Visit: Admitting: Family Medicine
